# Patient Record
Sex: Male | Born: 1975 | Race: Black or African American | Hispanic: No | Marital: Single | Smoking: Current every day smoker
Health system: Southern US, Community
[De-identification: ages and names within clinical notes are randomized; demographics above are authoritative.]

## PROBLEM LIST (undated history)

## (undated) DIAGNOSIS — S2249XA Multiple fractures of ribs, unspecified side, initial encounter for closed fracture: Secondary | ICD-10-CM

## (undated) HISTORY — PX: HERNIA REPAIR: SHX51

---

## 2018-04-14 DIAGNOSIS — S2249XA Multiple fractures of ribs, unspecified side, initial encounter for closed fracture: Secondary | ICD-10-CM

## 2018-04-14 HISTORY — DX: Multiple fractures of ribs, unspecified side, initial encounter for closed fracture: S22.49XA

## 2018-04-21 ENCOUNTER — Emergency Department (HOSPITAL_COMMUNITY): Payer: Self-pay

## 2018-04-21 ENCOUNTER — Other Ambulatory Visit: Payer: Self-pay

## 2018-04-21 ENCOUNTER — Inpatient Hospital Stay (HOSPITAL_COMMUNITY): Payer: Self-pay

## 2018-04-21 ENCOUNTER — Inpatient Hospital Stay (HOSPITAL_COMMUNITY)
Admission: EM | Admit: 2018-04-21 | Discharge: 2018-04-23 | DRG: 964 | Disposition: A | Discharge status: Home Health | Payer: Self-pay | Attending: Surgery | Admitting: Surgery

## 2018-04-21 DIAGNOSIS — F10129 Alcohol abuse with intoxication, unspecified: Secondary | ICD-10-CM | POA: Diagnosis present

## 2018-04-21 DIAGNOSIS — Y92410 Unspecified street and highway as the place of occurrence of the external cause: Secondary | ICD-10-CM

## 2018-04-21 DIAGNOSIS — F17200 Nicotine dependence, unspecified, uncomplicated: Secondary | ICD-10-CM | POA: Diagnosis present

## 2018-04-21 DIAGNOSIS — Y907 Blood alcohol level of 200-239 mg/100 ml: Secondary | ICD-10-CM | POA: Diagnosis present

## 2018-04-21 DIAGNOSIS — R402362 Coma scale, best motor response, obeys commands, at arrival to emergency department: Secondary | ICD-10-CM | POA: Diagnosis present

## 2018-04-21 DIAGNOSIS — R402142 Coma scale, eyes open, spontaneous, at arrival to emergency department: Secondary | ICD-10-CM | POA: Diagnosis present

## 2018-04-21 DIAGNOSIS — R52 Pain, unspecified: Secondary | ICD-10-CM

## 2018-04-21 DIAGNOSIS — S271XXA Traumatic hemothorax, initial encounter: Principal | ICD-10-CM | POA: Diagnosis present

## 2018-04-21 DIAGNOSIS — S2242XA Multiple fractures of ribs, left side, initial encounter for closed fracture: Secondary | ICD-10-CM | POA: Diagnosis present

## 2018-04-21 DIAGNOSIS — S73004A Unspecified dislocation of right hip, initial encounter: Secondary | ICD-10-CM | POA: Diagnosis present

## 2018-04-21 DIAGNOSIS — S270XXA Traumatic pneumothorax, initial encounter: Secondary | ICD-10-CM | POA: Diagnosis present

## 2018-04-21 DIAGNOSIS — S32491A Other specified fracture of right acetabulum, initial encounter for closed fracture: Secondary | ICD-10-CM | POA: Diagnosis present

## 2018-04-21 DIAGNOSIS — Z653 Problems related to other legal circumstances: Secondary | ICD-10-CM

## 2018-04-21 DIAGNOSIS — T07XXXA Unspecified multiple injuries, initial encounter: Secondary | ICD-10-CM | POA: Diagnosis present

## 2018-04-21 DIAGNOSIS — Z23 Encounter for immunization: Secondary | ICD-10-CM

## 2018-04-21 DIAGNOSIS — R402252 Coma scale, best verbal response, oriented, at arrival to emergency department: Secondary | ICD-10-CM | POA: Diagnosis present

## 2018-04-21 DIAGNOSIS — J942 Hemothorax: Secondary | ICD-10-CM

## 2018-04-21 HISTORY — DX: Multiple fractures of ribs, unspecified side, initial encounter for closed fracture: S22.49XA

## 2018-04-21 LAB — COMPREHENSIVE METABOLIC PANEL
ALK PHOS: 40 U/L (ref 38–126)
ALT: 32 U/L (ref 17–63)
ANION GAP: 7 (ref 5–15)
AST: 71 U/L — ABNORMAL HIGH (ref 15–41)
Albumin: 3.1 g/dL — ABNORMAL LOW (ref 3.5–5.0)
BILIRUBIN TOTAL: 0.7 mg/dL (ref 0.3–1.2)
BUN: 13 mg/dL (ref 6–20)
CO2: 27 mmol/L (ref 22–32)
Calcium: 8.2 mg/dL — ABNORMAL LOW (ref 8.9–10.3)
Chloride: 107 mmol/L (ref 101–111)
Creatinine, Ser: 1.23 mg/dL (ref 0.61–1.24)
GFR, EST AFRICAN AMERICAN: 39 mL/min — AB (ref >60)
GFR, EST NON AFRICAN AMERICAN: 34 mL/min — AB (ref >60)
Glucose, Bld: 118 mg/dL — ABNORMAL HIGH (ref 65–99)
Potassium: 3.8 mmol/L (ref 3.5–5.1)
Sodium: 141 mmol/L (ref 135–145)
TOTAL PROTEIN: 5.7 g/dL — AB (ref 6.5–8.1)

## 2018-04-21 LAB — BASIC METABOLIC PANEL
Anion gap: 10 (ref 5–15)
BUN: 10 mg/dL (ref 6–20)
CO2: 22 mmol/L (ref 22–32)
Calcium: 8.2 mg/dL — ABNORMAL LOW (ref 8.9–10.3)
Chloride: 109 mmol/L (ref 101–111)
Creatinine, Ser: 1.02 mg/dL (ref 0.61–1.24)
GFR calc Af Amer: >60 mL/min (ref >60)
GFR calc non Af Amer: >60 mL/min (ref >60)
Glucose, Bld: 109 mg/dL — ABNORMAL HIGH (ref 65–99)
POTASSIUM: 4 mmol/L (ref 3.5–5.1)
Sodium: 141 mmol/L (ref 135–145)

## 2018-04-21 LAB — CBC
HCT: 48.8 % (ref 39.0–52.0)
HEMATOCRIT: 47.5 % (ref 39.0–52.0)
HEMOGLOBIN: 16.2 g/dL (ref 13.0–17.0)
Hemoglobin: 15.7 g/dL (ref 13.0–17.0)
MCH: 30.1 pg (ref 26.0–34.0)
MCH: 30.1 pg (ref 26.0–34.0)
MCHC: 33.2 g/dL (ref 30.0–36.0)
MCHC: 33.1 g/dL (ref 30.0–36.0)
MCV: 90.5 fL (ref 78.0–100.0)
MCV: 91 fL (ref 78.0–100.0)
PLATELETS: 228 10*3/uL (ref 150–400)
Platelets: 216 10*3/uL (ref 150–400)
RBC: 5.39 MIL/uL (ref 4.22–5.81)
RBC: 5.22 MIL/uL (ref 4.22–5.81)
RDW: 12.8 % (ref 11.5–15.5)
RDW: 12.8 % (ref 11.5–15.5)
WBC: 13.9 10*3/uL — AB (ref 4.0–10.5)
WBC: 12.9 10*3/uL — ABNORMAL HIGH (ref 4.0–10.5)

## 2018-04-21 LAB — URINALYSIS, ROUTINE W REFLEX MICROSCOPIC
Bacteria, UA: NONE SEEN
Bilirubin Urine: NEGATIVE
GLUCOSE, UA: NEGATIVE mg/dL
Ketones, ur: NEGATIVE mg/dL
LEUKOCYTES UA: NEGATIVE
NITRITE: NEGATIVE
Protein, ur: NEGATIVE mg/dL
Specific Gravity, Urine: 1.018 (ref 1.005–1.030)
pH: 5 (ref 5.0–8.0)

## 2018-04-21 LAB — I-STAT CHEM 8, ED
BUN: 13 mg/dL (ref 6–20)
CALCIUM ION: 1.08 mmol/L — AB (ref 1.15–1.40)
CHLORIDE: 105 mmol/L (ref 101–111)
Creatinine, Ser: 1.3 mg/dL — ABNORMAL HIGH (ref 0.61–1.24)
GLUCOSE: 114 mg/dL — AB (ref 65–99)
HCT: 52 % (ref 39.0–52.0)
Hemoglobin: 17.7 g/dL — ABNORMAL HIGH (ref 13.0–17.0)
Potassium: 3.4 mmol/L — ABNORMAL LOW (ref 3.5–5.1)
Sodium: 143 mmol/L (ref 135–145)
TCO2: 27 mmol/L (ref 22–32)

## 2018-04-21 LAB — I-STAT CG4 LACTIC ACID, ED: Lactic Acid, Venous: 1.97 mmol/L — ABNORMAL HIGH (ref 0.5–1.9)

## 2018-04-21 LAB — PROTIME-INR
INR: 0.92
Prothrombin Time: 12.3 seconds (ref 11.4–15.2)

## 2018-04-21 LAB — ETHANOL: ALCOHOL ETHYL (B): 217 mg/dL — AB (ref <10)

## 2018-04-21 LAB — TYPE AND SCREEN
ABO/RH(D): O POS
ANTIBODY SCREEN: NEGATIVE

## 2018-04-21 LAB — CDS SEROLOGY

## 2018-04-21 LAB — HIV ANTIBODY (ROUTINE TESTING W REFLEX): HIV Screen 4th Generation wRfx: NONREACTIVE

## 2018-04-21 LAB — ABO/RH: ABO/RH(D): O POS

## 2018-04-21 MED ORDER — MORPHINE SULFATE (PF) 2 MG/ML IV SOLN: 1.0000 mg | INTRAVENOUS | Status: DC | PRN

## 2018-04-21 MED ORDER — FENTANYL CITRATE (PF) 100 MCG/2ML IJ SOLN: 50.0000 ug | Freq: Once | INTRAMUSCULAR | Status: DC

## 2018-04-21 MED ORDER — MORPHINE SULFATE (PF) 2 MG/ML IV SOLN
2.0000 mg | INTRAVENOUS | Status: DC | PRN
  Administered 2018-04-21 – 2018-04-23 (×10): 2 mg via INTRAVENOUS
  Filled 2018-04-21 (×10): qty 1

## 2018-04-21 MED ORDER — TETANUS-DIPHTH-ACELL PERTUSSIS 5-2.5-18.5 LF-MCG/0.5 IM SUSP
0.5000 mL | Freq: Once | INTRAMUSCULAR | Status: AC
  Administered 2018-04-21: 0.5 mL via INTRAMUSCULAR
  Filled 2018-04-21: qty 0.5

## 2018-04-21 MED ORDER — MORPHINE SULFATE (PF) 4 MG/ML IV SOLN: 4.0000 mg | INTRAVENOUS | Status: DC | PRN

## 2018-04-21 MED ORDER — ONDANSETRON 4 MG PO TBDP: 4.0000 mg | ORAL_TABLET | Freq: Four times a day (QID) | ORAL | Status: DC | PRN

## 2018-04-21 MED ORDER — ENOXAPARIN SODIUM 40 MG/0.4ML ~~LOC~~ SOLN
40.0000 mg | Freq: Every day | SUBCUTANEOUS | Status: DC
  Administered 2018-04-21 – 2018-04-23 (×3): 40 mg via SUBCUTANEOUS
  Filled 2018-04-21 (×3): qty 0.4

## 2018-04-21 MED ORDER — POTASSIUM CHLORIDE IN NACL 20-0.9 MEQ/L-% IV SOLN
INTRAVENOUS | Status: DC
  Administered 2018-04-21 (×3): via INTRAVENOUS
  Filled 2018-04-21 (×3): qty 1000

## 2018-04-21 MED ORDER — PROPOFOL 10 MG/ML IV BOLUS
1.0000 mg/kg | Freq: Once | INTRAVENOUS | Status: AC
  Administered 2018-04-21: 76.7 mg via INTRAVENOUS
  Filled 2018-04-21: qty 20

## 2018-04-21 MED ORDER — SODIUM CHLORIDE 0.9 % IV BOLUS
125.0000 mL | Freq: Once | INTRAVENOUS | Status: AC
Start: 2018-04-21 — End: 2018-04-21
  Administered 2018-04-21: 125 mL via INTRAVENOUS

## 2018-04-21 MED ORDER — OXYCODONE HCL 5 MG PO TABS: 5.0000 mg | ORAL_TABLET | ORAL | Status: DC | PRN

## 2018-04-21 MED ORDER — IOHEXOL 300 MG/ML  SOLN
100.0000 mL | Freq: Once | INTRAMUSCULAR | Status: AC | PRN
  Administered 2018-04-21: 100 mL via INTRAVENOUS

## 2018-04-21 MED ORDER — HYDROMORPHONE HCL 2 MG/ML IJ SOLN
1.0000 mg | Freq: Once | INTRAMUSCULAR | Status: AC
  Administered 2018-04-21: 1 mg via INTRAVENOUS
  Filled 2018-04-21: qty 1

## 2018-04-21 MED ORDER — FENTANYL CITRATE (PF) 100 MCG/2ML IJ SOLN
50.0000 ug | Freq: Once | INTRAMUSCULAR | Status: AC
  Administered 2018-04-21: 50 ug via INTRAVENOUS
  Filled 2018-04-21: qty 2

## 2018-04-21 MED ORDER — HYDROMORPHONE HCL 2 MG/ML IJ SOLN: 1.0000 mg | INTRAMUSCULAR | Status: DC | PRN

## 2018-04-21 MED ORDER — OXYCODONE HCL 5 MG PO TABS
10.0000 mg | ORAL_TABLET | ORAL | Status: DC | PRN
  Administered 2018-04-21 – 2018-04-23 (×11): 10 mg via ORAL
  Filled 2018-04-21 (×11): qty 2

## 2018-04-21 MED ORDER — ONDANSETRON HCL 4 MG/2ML IJ SOLN: 4.0000 mg | Freq: Four times a day (QID) | INTRAMUSCULAR | Status: DC | PRN
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
## 2018-04-21 NOTE — Sedation Documentation (Signed)
Successful reduction of R hip by Rancour, MD and Leaphart, PA.

## 2018-04-21 NOTE — ED Notes (Signed)
Patient transported to CT 
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
## 2018-04-21 NOTE — Progress Notes (Signed)
   04/21/18 0000  Clinical Encounter Type  Visited With Patient  Visit Type Trauma  Referral From Nurse  Consult/Referral To Chaplain  Spiritual Encounters  Spiritual Needs Emotional  Stress Factors  Patient Stress Factors Health changes  Chaplain visited with the PT, PT was refusing to talk until he got some paid medication.  The Pt is not combative but not willing to speak with Chaplain.

## 2018-04-21 NOTE — Sedation Documentation (Signed)
X-ray at bedside

## 2018-04-21 NOTE — Progress Notes (Signed)
Orthopedic Tech Progress Note Patient Details:  Dennis Marquez 11/04/1976 [id number]  Ortho Devices Type of Ortho Device: Knee Immobilizer Ortho Device/Splint Location: rle. applied post reduction. Ortho Device/Splint Interventions: Ordered, Application, Adjustment   Post Interventions Patient Tolerated: Well Instructions Provided: Care of device, Adjustment of device   Trinna PostMartinez, Mahin Guardia J 04/21/2018, 3:51 AM
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
## 2018-04-21 NOTE — Progress Notes (Addendum)
0500 received pt from ED. Pt very sedated but responds to voice. Cooperative, complains of pain 10/10 for ribcage.  Asks for mother to be called [place].  Name is Diane # [phone]. Attempted to call, no vm.

## 2018-04-22 ENCOUNTER — Inpatient Hospital Stay (HOSPITAL_COMMUNITY): Payer: Self-pay

## 2018-04-22 MED ORDER — KCL IN DEXTROSE-NACL 20-5-0.45 MEQ/L-%-% IV SOLN
INTRAVENOUS | Status: DC
  Administered 2018-04-22 – 2018-04-23 (×2): via INTRAVENOUS
  Filled 2018-04-22 (×2): qty 1000

## 2018-04-22 MED ORDER — KETOROLAC TROMETHAMINE 15 MG/ML IJ SOLN
15.0000 mg | Freq: Three times a day (TID) | INTRAMUSCULAR | Status: DC
  Administered 2018-04-22 – 2018-04-23 (×5): 15 mg via INTRAVENOUS
  Filled 2018-04-22 (×5): qty 1

## 2018-04-22 MED ORDER — ACETAMINOPHEN 325 MG PO TABS
650.0000 mg | ORAL_TABLET | Freq: Four times a day (QID) | ORAL | Status: DC
  Administered 2018-04-22 – 2018-04-23 (×4): 650 mg via ORAL
  Filled 2018-04-22 (×4): qty 2

## 2018-04-22 MED ORDER — METHOCARBAMOL 500 MG PO TABS
500.0000 mg | ORAL_TABLET | Freq: Four times a day (QID) | ORAL | Status: DC | PRN
Start: 2018-04-22 — End: 2018-04-23
  Administered 2018-04-22 – 2018-04-23 (×3): 500 mg via ORAL
  Filled 2018-04-22 (×3): qty 1
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
## 2018-04-22 NOTE — Consult Note (Signed)
I have reviewed his xrays  Tentative plan: -Will obtain post reduction CT -TDWB of RLE for 2wks then reassess imaging as an outpatient.  -Anterior hip precautions    Formal C/s to follow
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
## 2018-04-23 ENCOUNTER — Encounter (HOSPITAL_COMMUNITY): Payer: Self-pay | Admitting: General Practice

## 2018-04-23 MED ORDER — GUAIFENESIN ER 600 MG PO TB12
1200.0000 mg | ORAL_TABLET | Freq: Two times a day (BID) | ORAL | Status: DC
  Administered 2018-04-23: 1200 mg via ORAL
  Filled 2018-04-23: qty 2

## 2018-04-23 MED ORDER — MORPHINE SULFATE (PF) 2 MG/ML IV SOLN: 2.0000 mg | INTRAVENOUS | Status: DC | PRN

## 2018-04-23 MED ORDER — IBUPROFEN 600 MG PO TABS: 600.0000 mg | ORAL_TABLET | Freq: Four times a day (QID) | ORAL | Status: DC | PRN

## 2018-04-23 MED ORDER — POLYETHYLENE GLYCOL 3350 17 G PO PACK: 17.0000 g | PACK | Freq: Every day | ORAL | Status: DC | PRN

## 2018-04-23 MED ORDER — METHOCARBAMOL 750 MG PO TABS: 750.0000 mg | ORAL_TABLET | Freq: Four times a day (QID) | ORAL | 1 refills | Status: AC

## 2018-04-23 MED ORDER — WHITE PETROLATUM EX OINT
TOPICAL_OINTMENT | CUTANEOUS | Status: AC
  Administered 2018-04-23: 11:00:00
  Filled 2018-04-23: qty 28.35

## 2018-04-23 MED ORDER — TRAMADOL HCL 50 MG PO TABS: ORAL_TABLET | ORAL | 0 refills | Status: AC

## 2018-04-23 MED ORDER — OXYCODONE HCL 5 MG PO TABS
5.0000 mg | ORAL_TABLET | ORAL | Status: DC | PRN
  Administered 2018-04-23: 10 mg via ORAL
  Filled 2018-04-23: qty 2

## 2018-04-23 MED ORDER — IBUPROFEN 200 MG PO TABS: ORAL_TABLET | ORAL | Status: AC

## 2018-04-23 MED ORDER — ACETAMINOPHEN 500 MG PO TABS
1000.0000 mg | ORAL_TABLET | Freq: Four times a day (QID) | ORAL | Status: DC
  Administered 2018-04-23 (×2): 1000 mg via ORAL
  Filled 2018-04-23 (×2): qty 2

## 2018-04-23 MED ORDER — METHOCARBAMOL 750 MG PO TABS
750.0000 mg | ORAL_TABLET | Freq: Four times a day (QID) | ORAL | Status: DC
  Administered 2018-04-23: 750 mg via ORAL
  Filled 2018-04-23: qty 1

## 2018-04-23 MED ORDER — OXYCODONE HCL 5 MG PO TABS: 5.0000 mg | ORAL_TABLET | ORAL | 0 refills | Status: AC | PRN

## 2018-04-23 MED ORDER — POLYETHYLENE GLYCOL 3350 17 G PO PACK: 17.0000 g | PACK | Freq: Every day | ORAL | Status: DC

## 2018-04-23 MED ORDER — POLYETHYLENE GLYCOL 3350 17 G PO PACK: PACK | ORAL | 0 refills | Status: AC

## 2018-04-23 MED ORDER — ACETAMINOPHEN 500 MG PO TABS: ORAL_TABLET | ORAL | 0 refills | Status: AC

## 2018-04-23 MED ORDER — DOCUSATE SODIUM 100 MG PO CAPS
200.0000 mg | ORAL_CAPSULE | Freq: Every day | ORAL | Status: DC
  Administered 2018-04-23: 200 mg via ORAL
  Filled 2018-04-23: qty 2

## 2018-04-23 MED ORDER — DOCUSATE SODIUM 100 MG PO CAPS: ORAL_CAPSULE | ORAL | 0 refills | Status: AC

## 2018-04-23 MED ORDER — GUAIFENESIN ER 600 MG PO TB12: ORAL_TABLET | ORAL | Status: AC

## 2018-04-23 MED FILL — oxyCODONE HCL 5 MG TABS: 5 | 3 days supply | Qty: 30 | Fill #0

## 2018-04-23 MED FILL — METHOCARBAMOL 750 MG TABS: 750 | 15 days supply | Qty: 60 | Fill #0

## 2018-04-23 MED FILL — traMADol HCL 50 MG TABS: 50 | 4 days supply | Qty: 30 | Fill #0
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
## 2018-04-23 NOTE — Progress Notes (Signed)
Pt is discharged to go home with home health.  Assistive devices given.  Discharge instructions and prescriptions given.

## 2018-04-23 NOTE — Discharge Instructions (Signed)
Rib Fracture Stop smoking if you can!!! Continues to do the incentive spirometry every hour while awake, to the flutter valve every 2 hours as needed for thick sputum secretions. A rib fracture is a break or crack in one of the bones of the ribs. The ribs are like a cage that goes around your upper chest. A broken or cracked rib is often painful, but most do not cause other problems. Most rib fractures heal on their own in 1-3 months. Follow these instructions at home:  Avoid activities that cause pain to the injured area. Protect your injured area.  Slowly increase activity as told by your doctor.  Take medicine as told by your doctor.  Put ice on the injured area for the first 1-2 days after you have been treated or as told by your doctor. ? Put ice in a plastic bag. ? Place a towel between your skin and the bag. ? Leave the ice on for 15-20 minutes at a time, every 2 hours while you are awake.  Do deep breathing as told by your doctor. You may be told to: ? Take deep breaths many times a day. ? Cough many times a day while hugging a pillow. ? Use a device (incentive spirometer) to perform deep breathing many times a day.  Drink enough fluids to keep your pee (urine) clear or pale yellow.  Do not wear a rib belt or binder. These do not allow you to breathe deeply. Get help right away if:  You have a fever.  You have trouble breathing.  You cannot stop coughing.  You cough up thick or bloody spit (mucus).  You feel sick to your stomach (nauseous), throw up (vomit), or have belly (abdominal) pain.  Your pain gets worse and medicine does not help. This information is not intended to replace advice given to you by your health care provider. Make sure you discuss any questions you have with your health care provider. Document Released: 08/09/2008 Document Revised: 04/07/2016 Document Reviewed: 01/02/2013 Elsevier Interactive Patient Education  2018 [employer]    Hip  Dislocation Right lower extremity: Weight Bearing: Touchdown weight bearing as instructed by Physical therapy here in the hospital. Anterior hip precautions as instructed by Physical therapy    Anterior hip precautions- avoid external rotation and hip extension Knee immobilizer for sleep each night until you talk with Dr. Eulah Marquez Touchdown weightbearing RLE x2 weeks.   Hip dislocation is when the bones in your hip joint move out of place. To treat this, your doctor must move your bones back into place (reduction). This condition is an emergency. If you think you have dislocated your hip, do not move. Get medical help right away. Symptoms of a dislocated hip may include:  Very bad pain in your hip area. Pain may get worse when you move or when you try to use your hip to support (bear) your weight.  Not being able to move the hip.  Having the leg of the dislocated hip looking shorter than the other leg.  Inward turning of the foot on the side of the dislocated hip.  Loss of feeling in your lower leg, foot, or ankle.  Follow these instructions at home: If you have a splint:  Do not put pressure on any part of the splint until it is fully hardened. This may take many hours.  Wear the splint as told by your doctor. Remove it only as told by your doctor.  Loosen the splint if your  toes tingle, get numb, or turn cold and blue.  Do not let your splint get wet if it is not waterproof. ? Do not take baths, swim, or use a hot tub until your doctor says it is okay. Ask your doctor if you can take showers. You may only be allowed to take sponge baths for bathing. ? If your splint is not waterproof, cover it with a watertight plastic bag when you take a bath or a shower.  Keep the splint clean. Managing pain, stiffness, and swelling  If directed, put ice on the injured area: ? Put ice in a plastic bag. ? Place a towel between your skin and the bag. ? Leave the ice on for 20 minutes, 2-3  times a day.  Wear compression stockings or wraps as told by your doctor.  Move your toes often to avoid stiffness and to lessen swelling. Driving  Do not drive or use heavy machinery while taking prescription pain medicine, or as told by your doctor.  Ask your doctor when it is safe to drive if you have a splint on your hip. Activity  Return to your normal activities as told by your doctor. Ask your doctor what activities are safe for you.  If physical therapy was prescribed, do exercises as told by your doctor. Safety  Do not use your hip to support your body weight until your doctor says that you can. Use crutches or a walker as told by your doctor. General instructions  Do not use any tobacco products, such as cigarettes, chewing tobacco, and e-cigarettes. Tobacco can delay bone healing. If you need help quitting, ask your doctor.  Take over-the-counter and prescription medicines only as told by your doctor.  Keep all follow-up visits as told by your doctor. This is important. How is this prevented?  If you have trouble walking or keeping your balance, try using a cane or a walker. If you feel unstable, sit down right away.  Exercise regularly, as told by your doctor.  Warm up and stretch before being active.  Cool down and stretch after being active. Contact a doctor if:  You have pain that gets worse.  You have pain that does not get better with medicine.  You have swelling in your hip area or your leg.  You have red skin on your hip area or your leg.  You cannot move any part of your hip or leg.  You feel tingling in any part of your hip, leg, or foot. Get help right away if:  You feel like your hip has dislocated again.  You have very bad pain in your hip or groin.  You have numbness or weakness in your leg. If you have symptoms of a hip dislocation, do not wait to see if the symptoms will go away. Get medical help right away. Call your local emergency  services (911 in the [place] Do not drive yourself to the hospital. This information is not intended to replace advice given to you by your health care provider. Make sure you discuss any questions you have with your health care provider. Document Released: 06/30/2011 Document Revised: 04/07/2016 Document Reviewed: 06/02/2015 Elsevier Interactive Patient Education  [employer]2018 [employer]
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
## 2024-08-03 DIAGNOSIS — S68120A Partial traumatic metacarpophalangeal amputation of right index finger, initial encounter: Secondary | ICD-10-CM | POA: Diagnosis not present

## 2024-08-05 DIAGNOSIS — F17210 Nicotine dependence, cigarettes, uncomplicated: Secondary | ICD-10-CM | POA: Diagnosis not present

## 2024-08-05 DIAGNOSIS — Z79899 Other long term (current) drug therapy: Secondary | ICD-10-CM | POA: Diagnosis not present

## 2024-08-05 DIAGNOSIS — S67190A Crushing injury of right index finger, initial encounter: Secondary | ICD-10-CM | POA: Diagnosis not present

## 2024-08-05 DIAGNOSIS — S68126A Partial traumatic metacarpophalangeal amputation of right little finger, initial encounter: Secondary | ICD-10-CM | POA: Diagnosis not present

## 2024-08-05 DIAGNOSIS — F1290 Cannabis use, unspecified, uncomplicated: Secondary | ICD-10-CM | POA: Diagnosis not present

## 2024-08-05 DIAGNOSIS — W208XXA Other cause of strike by thrown, projected or falling object, initial encounter: Secondary | ICD-10-CM | POA: Diagnosis not present

## 2024-08-13 DIAGNOSIS — S68110A Complete traumatic metacarpophalangeal amputation of right index finger, initial encounter: Secondary | ICD-10-CM | POA: Diagnosis not present

## 2024-09-23 DIAGNOSIS — S68110A Complete traumatic metacarpophalangeal amputation of right index finger, initial encounter: Secondary | ICD-10-CM | POA: Diagnosis not present

## 2024-12-18 ENCOUNTER — Ambulatory Visit: Payer: Self-pay | Admitting: Physician Assistant
